# Patient Record
Sex: Male | Born: 1950 | Race: White | Hispanic: No | Marital: Married | Smoking: Never smoker
Health system: Southern US, Community
[De-identification: ages and names within clinical notes are randomized; demographics above are authoritative.]

## PROBLEM LIST (undated history)

## (undated) DIAGNOSIS — C439 Malignant melanoma of skin, unspecified: Secondary | ICD-10-CM

## (undated) DIAGNOSIS — K219 Gastro-esophageal reflux disease without esophagitis: Secondary | ICD-10-CM

## (undated) DIAGNOSIS — E039 Hypothyroidism, unspecified: Secondary | ICD-10-CM

## (undated) HISTORY — PX: COLONOSCOPY: SHX174

## (undated) HISTORY — PX: NASAL SINUS SURGERY: SHX719

## (undated) HISTORY — DX: Hypothyroidism, unspecified: E03.9

## (undated) HISTORY — DX: Gastro-esophageal reflux disease without esophagitis: K21.9

## (undated) HISTORY — PX: OTHER SURGICAL HISTORY: SHX169

## (undated) HISTORY — PX: MELANOMA EXCISION: SHX5266

## (undated) HISTORY — DX: Malignant melanoma of skin, unspecified: C43.9

---

## 2015-05-05 ENCOUNTER — Telehealth: Payer: Self-pay | Admitting: Cardiovascular Disease

## 2015-05-05 NOTE — Telephone Encounter (Signed)
ROI faxed to [facility] to obtain records.

## 2015-05-13 ENCOUNTER — Telehealth: Payer: Self-pay | Admitting: Cardiovascular Disease

## 2015-05-13 NOTE — Telephone Encounter (Signed)
Records rec from [place] placed in chart prep bin. Patient has NP patient appt 06/10/15 with Dr. Acie Fredrickson

## 2015-06-09 ENCOUNTER — Encounter: Payer: Self-pay | Admitting: *Deleted

## 2015-06-10 ENCOUNTER — Encounter: Payer: Self-pay | Admitting: Cardiology

## 2015-06-10 ENCOUNTER — Ambulatory Visit (INDEPENDENT_AMBULATORY_CARE_PROVIDER_SITE_OTHER): Payer: PRIVATE HEALTH INSURANCE | Admitting: Cardiology

## 2015-06-10 VITALS — BP 110/58 | HR 74 | Ht 68.0 in | Wt 168.2 lb

## 2015-06-10 DIAGNOSIS — Z9889 Other specified postprocedural states: Secondary | ICD-10-CM | POA: Insufficient documentation

## 2015-06-10 DIAGNOSIS — R079 Chest pain, unspecified: Secondary | ICD-10-CM | POA: Diagnosis not present

## 2015-06-10 DIAGNOSIS — K219 Gastro-esophageal reflux disease without esophagitis: Secondary | ICD-10-CM | POA: Diagnosis not present

## 2015-06-10 HISTORY — DX: Gastro-esophageal reflux disease without esophagitis: K21.9

## 2015-06-10 MED ORDER — FAMOTIDINE 20 MG PO TABS: 20.0000 mg | ORAL_TABLET | Freq: Every day | ORAL | Status: AC

## 2015-06-10 NOTE — Progress Notes (Signed)
Cardiology Office Note   Date:  06/10/2015   ID:  Robert Wolfe, DOB [birth year], MRN [id number]  PCP:  PROVIDER NOT IN SYSTEM    Chief Complaint  Patient presents with  . New Evaluation    chest pain      History of Present Illness: Robert Wolfe is a 64 y.o. male who presents for evaluation of chest pain.  He says that the pain always occurs at night.  It will wake him up and is burning up through his chest into his throat and then will have sharp pains.  The discomfort will be so bad that he feels he cant breathe.  It is worse with certain types of foods.  He will get gas with the pain and he will start to belch.  The pain radiates into his left arm.  Once he has belched a lot it relieves the pain.  He started taking Rolaids at night which has significantly helped.  He denies any burning in his chest during the day except early in the am when he is doing exercises.  He denies any palpitations, dizziness or syncope.  He denies any LE edema.    Past Medical History  Diagnosis Date  . Melanoma   . Hypothyroid     Past Surgical History  Procedure Laterality Date  . Broken nose    . Colonoscopy    . Nasal sinus surgery       Current Outpatient Prescriptions  Medication Sig Dispense Refill  . thyroid (NATURE-THROID) 32.5 MG tablet Take 32.5 mg by mouth daily.     No current facility-administered medications for this visit.    Allergies:   Review of patient's allergies indicates no known allergies.    Social History:  The patient  reports that he has never smoked. He does not have any smokeless tobacco history on file. He reports that he does not drink alcohol or use illicit drugs.   Family History:  The patient's family history is not on file.    ROS:  Please see the history of present illness.   Otherwise, review of systems are positive for none.   All other systems are reviewed and negative.    PHYSICAL EXAM: VS:  BP 110/58 mmHg  Pulse 74  Ht  5\' 8"  (1.727 m)  Wt 168 lb 3.2 oz (76.295 kg)  BMI 25.58 kg/m2 , BMI Body mass index is 25.58 kg/(m^2). GEN: Well nourished, well developed, in no acute distress HEENT: normal Neck: no JVD, carotid bruits, or masses Cardiac: RRR; no murmurs, rubs, or gallops,no edema  Respiratory:  clear to auscultation bilaterally, normal work of breathing GI: soft, nontender, nondistended, + BS MS: no deformity or atrophy Skin: warm and dry, no rash Neuro:  Strength and sensation are intact Psych: euthymic mood, full affect   EKG:  EKG is ordered today. The ekg ordered today demonstrates NSR at 74bpm with nonspecific T wave abnormality   Recent Labs: No results found for requested labs within last 365 days.    Lipid Panel No results found for: CHOL, TRIG, HDL, CHOLHDL, VLDL, LDLCALC, LDLDIRECT    Wt Readings from Last 3 Encounters:  06/10/15 168 lb 3.2 oz (76.295 kg)        ASSESSMENT AND PLAN:  1.  Chest pain that is mostly atypical.  It mainly only occurs at night and is burning with a lot of belching and  sharp pain.  This is most likely due to GERD with esophageal spasm.  His only risk factors for CAD are male sex and age>40.  I will set him up for an ETT to rule out ischemia.   2.  Hypothyroidism - per PCP 3.  GERD - I am going to start him on OTC Prilosec 20mg  daily.  I will have him followup with my extender in 4 weeks to see if he is better   Current medicines are reviewed at length with the patient today.  The patient does not have concerns regarding medicines.  The following changes have been made:  no change  Labs/ tests ordered today: See above Assessment and Plan No orders of the defined types were placed in this encounter.     Disposition:   FU with me PRN pending results of studies  Signed, Sueanne Margarita, MD  06/10/2015 4:21 PM    [facility] [place], [place], [place]  [address] Phone: [phone]; Fax: [phone]

## 2015-06-10 NOTE — Patient Instructions (Signed)
Medication Instructions:  Your physician has recommended you make the following change in your medication:  1) START OTC PEPCID 20 mg daily  Labwork: None  Testing/Procedures: Your physician has requested that you have an exercise tolerance test. For further information please visit [email or web address]. Please also follow instruction sheet, as given.  Follow-Up: Your physician recommends that you schedule a follow-up appointment in: 4 weeks with a PA or NP (after stress test).  Your physician recommends that you schedule a follow-up appointment AS NEEDED with Dr. Radford Pax pending your study results.  Any Other Special Instructions Will Be Listed Below (If Applicable).

## 2015-06-17 ENCOUNTER — Ambulatory Visit: Payer: Self-pay | Admitting: Cardiovascular Disease

## 2015-07-07 ENCOUNTER — Telehealth (HOSPITAL_COMMUNITY): Payer: Self-pay

## 2015-07-07 NOTE — Telephone Encounter (Signed)
Encounter complete. 

## 2015-07-08 ENCOUNTER — Telehealth (HOSPITAL_COMMUNITY): Payer: Self-pay

## 2015-07-08 NOTE — Telephone Encounter (Signed)
Encounter complete. 

## 2015-07-09 ENCOUNTER — Ambulatory Visit (HOSPITAL_COMMUNITY)
Admission: RE | Admit: 2015-07-09 | Discharge: 2015-07-09 | Disposition: A | Discharge status: Home / Self Care | Payer: Managed Care, Other (non HMO) | Source: Ambulatory Visit | Attending: Cardiology | Admitting: Cardiology

## 2015-07-09 DIAGNOSIS — R079 Chest pain, unspecified: Secondary | ICD-10-CM | POA: Diagnosis present

## 2015-07-09 DIAGNOSIS — R9439 Abnormal result of other cardiovascular function study: Secondary | ICD-10-CM | POA: Insufficient documentation

## 2015-07-10 LAB — EXERCISE TOLERANCE TEST
CHL CUP MPHR: 157 {beats}/min
CHL CUP RESTING HR STRESS: 76 {beats}/min
CSEPED: 13 min
CSEPEDS: 30 s
CSEPEW: 16.2 METS
Peak HR: 164 {beats}/min
Percent HR: 104 %
RPE: 16

## 2015-07-16 NOTE — Progress Notes (Signed)
Cardiology Office Note   Date:  07/16/2015   ID:  Robert Wolfe, DOB 1951/03/13, MRN [id number]  PCP:  PROVIDER NOT IN SYSTEM  Cardiologist:  Dr. Fransico Him   Electrophysiologist:  n/a  No chief complaint on file.    History of Present Illness: Robert Wolfe is a 64 y.o. male with a hx of    Studies/Reports Reviewed Today:  ETT 10/16 Abnormal exercise treadmill stress test with 4 mm horizontal ST depression during peak stress in the inferior and lateral leads suggestive of ischemia. The patient, however, experienced no chest pain and exercise tolerance was excellent with 16.2 METS achieved. Clinical correlation is advised. Additional testing may be warranted.    Past Medical History  Diagnosis Date  . Melanoma   . Hypothyroid   . GERD (gastroesophageal reflux disease) 06/10/2015    Past Surgical History  Procedure Laterality Date  . Broken nose    . Colonoscopy    . Nasal sinus surgery    . Melanoma excision       Current Outpatient Prescriptions  Medication Sig Dispense Refill  . famotidine (PEPCID) 20 MG tablet Take 1 tablet (20 mg total) by mouth daily.    Marland Kitchen thyroid (NATURE-THROID) 32.5 MG tablet Take 32.5 mg by mouth daily.     No current facility-administered medications for this visit.    Allergies:   Review of patient's allergies indicates no known allergies.    Social History:  The patient  reports that he has never smoked. He does not have any smokeless tobacco history on file. He reports that he does not drink alcohol or use illicit drugs.   Family History:  The patient's family history is not on file.    ROS:   Please see the history of present illness.   ROS    PHYSICAL EXAM: VS:  There were no vitals taken for this visit.    Wt Readings from Last 3 Encounters:  06/10/15 168 lb 3.2 oz (76.295 kg)     GEN: Well nourished, well developed, in no acute distress HEENT: normal Neck: no JVD, no carotid bruits, no masses Cardiac:   Normal S1/S2, RRR; no murmur ,  no rubs or gallops, no edema  Respiratory:  clear to auscultation bilaterally, no wheezing, rhonchi or rales. GI: soft, nontender, nondistended, + BS MS: no deformity or atrophy Skin: warm and dry  Neuro:  CNs II-XII intact, Strength and sensation are intact Psych: Normal affect   EKG:  EKG is ordered today.  It demonstrates:      Recent Labs: No results found for requested labs within last 365 days.    Lipid Panel No results found for: CHOL, TRIG, HDL, CHOLHDL, VLDL, LDLCALC, LDLDIRECT    ASSESSMENT AND PLAN:      Medication Changes: Current medicines are reviewed at length with the patient today.  Concerns regarding medicines are as outlined above.  The following changes have been made:   Discontinued Medications   No medications on file   Modified Medications   No medications on file   New Prescriptions   No medications on file   Labs/ tests ordered today include:   No orders of the defined types were placed in this encounter.      Disposition:    FU with     Signed, Richardson Dopp, PA-C, MHS 07/16/2015 5:10 PM    [facility] [place], [place], [place]  [address] Phone: [phone]; Fax: [phone]  This encounter was created in error - please disregard.

## 2015-07-17 ENCOUNTER — Encounter: Payer: PRIVATE HEALTH INSURANCE | Admitting: Physician Assistant

## 2015-07-17 ENCOUNTER — Telehealth: Payer: Self-pay

## 2015-07-17 DIAGNOSIS — R9431 Abnormal electrocardiogram [ECG] [EKG]: Secondary | ICD-10-CM

## 2015-07-17 DIAGNOSIS — Z01812 Encounter for preprocedural laboratory examination: Secondary | ICD-10-CM

## 2015-07-17 NOTE — Telephone Encounter (Signed)
-----   Message from Sueanne Margarita, MD sent at 07/12/2015 11:58 PM EDT ----- Please let patient know that stress test was abnormal but symptoms are very atypical for CAD. Please set up for coronary CTA with morphology and calcium score ASAP for Dr. Meda Coffee to read

## 2015-07-17 NOTE — Telephone Encounter (Signed)
Informed patient of results and verbal understanding expressed.   Coronary CT ordered for scheduling. Patient agrees with treatment plan. 

## 2015-07-21 ENCOUNTER — Telehealth: Payer: Self-pay

## 2015-07-21 NOTE — Telephone Encounter (Signed)
From: Sueanne Margarita, MD   Sent: 07/20/2015 12:08 PM    To: Lillia Pauls  Subject: RE: coronary CT                  Please order a calcium score    Left message to call back re: Case will not approve d/t pt has not had any negative enzymes or positive CA score.

## 2015-07-22 NOTE — Telephone Encounter (Signed)
Informed patient that insurance will not cover coronary CT without a calcium score done first. Informed patient a calcium score would be self pay $150. Patient is annoyed that insurance will not cover the calcium score. He understands to think about this an confirms he will call tomorrow to have the calcium score scheduled or not.

## 2015-07-22 NOTE — Telephone Encounter (Signed)
Follow up  ° ° °Patient returning call back to nurse  °

## 2015-07-23 ENCOUNTER — Encounter: Payer: Self-pay | Admitting: Cardiology

## 2015-08-03 ENCOUNTER — Ambulatory Visit (HOSPITAL_COMMUNITY): Admission: RE | Admit: 2015-08-03 | Payer: PRIVATE HEALTH INSURANCE | Source: Ambulatory Visit

## 2015-08-04 ENCOUNTER — Other Ambulatory Visit: Payer: Self-pay

## 2015-08-04 ENCOUNTER — Other Ambulatory Visit (INDEPENDENT_AMBULATORY_CARE_PROVIDER_SITE_OTHER): Payer: PRIVATE HEALTH INSURANCE | Admitting: *Deleted

## 2015-08-04 DIAGNOSIS — R079 Chest pain, unspecified: Secondary | ICD-10-CM

## 2015-08-04 DIAGNOSIS — Z01812 Encounter for preprocedural laboratory examination: Secondary | ICD-10-CM | POA: Diagnosis not present

## 2015-08-04 LAB — BASIC METABOLIC PANEL
BUN: 18 mg/dL (ref 7–25)
CHLORIDE: 103 mmol/L (ref 98–110)
CO2: 26 mmol/L (ref 20–31)
CREATININE: 0.91 mg/dL (ref 0.70–1.25)
Calcium: 8.6 mg/dL (ref 8.6–10.3)
Glucose, Bld: 79 mg/dL (ref 65–99)
POTASSIUM: 4 mmol/L (ref 3.5–5.3)
Sodium: 136 mmol/L (ref 135–146)

## 2015-08-04 LAB — TROPONIN I

## 2015-08-04 NOTE — Addendum Note (Signed)
Addended by: Eulis Foster on: 08/04/2015 04:14 PM   Modules accepted: Orders

## 2015-08-09 ENCOUNTER — Encounter: Payer: Self-pay | Admitting: Cardiology

## 2015-08-11 ENCOUNTER — Other Ambulatory Visit: Payer: Self-pay

## 2015-08-11 DIAGNOSIS — R079 Chest pain, unspecified: Secondary | ICD-10-CM

## 2015-09-08 ENCOUNTER — Telehealth: Payer: Self-pay

## 2015-09-08 NOTE — Telephone Encounter (Signed)
Robert Wolfe - Cardiac Ct >','<< Less Detail',event)" href="javascript:;">More Detail >>   Cardiac Ct    Armando Gang    Sent: Tue September 08, 2015 4:40 PM    To: Theodoro Parma, RN        Message     On 08-13-15 Left message to call and schedule CT.  Spoke with [name] on 08-18-15 to schedule test. He didn't wan to schedule test not until he spoke with his insurance to see how much he will have pay out of pocket.  Spoke with patient today, to see if he wanted to schedule the test. He don't due to having problems with stomach. He is going to see a stomach doctor this week. He don't wish to do the test.

## 2015-09-11 ENCOUNTER — Other Ambulatory Visit: Payer: Self-pay | Admitting: Physician Assistant

## 2015-09-11 DIAGNOSIS — R0789 Other chest pain: Secondary | ICD-10-CM

## 2015-09-11 DIAGNOSIS — K219 Gastro-esophageal reflux disease without esophagitis: Secondary | ICD-10-CM

## 2015-09-13 ENCOUNTER — Encounter: Payer: Self-pay | Admitting: Cardiology

## 2015-09-15 ENCOUNTER — Ambulatory Visit
Admission: RE | Admit: 2015-09-15 | Discharge: 2015-09-15 | Disposition: A | Discharge status: Home / Self Care | Payer: PRIVATE HEALTH INSURANCE | Source: Ambulatory Visit | Attending: Physician Assistant | Admitting: Physician Assistant

## 2015-09-15 ENCOUNTER — Telehealth: Payer: Self-pay | Admitting: Cardiology

## 2015-09-15 DIAGNOSIS — K219 Gastro-esophageal reflux disease without esophagitis: Secondary | ICD-10-CM

## 2015-09-15 DIAGNOSIS — R0789 Other chest pain: Secondary | ICD-10-CM

## 2015-09-15 NOTE — Telephone Encounter (Signed)
09-15-15 Auth for Cardiac CTA, CPT [id number] updated.  Per [place], [phone], previous expired (09-02-15) [id number] updated and extended to 10-03-15. Message sent to Mack Guise to r/s pt.

## 2015-09-17 ENCOUNTER — Telehealth: Payer: Self-pay | Admitting: Cardiology

## 2015-09-17 NOTE — Telephone Encounter (Signed)
09-14-15 Left voice message to call and schedule Cardiac Ct.   Today spoke with Robert Wolfe to schedule test.  He stated that he and his wife has decided to wait til the being of the year to have the test done.  I explain that I will to send the information back to precert and I will call him when I get the ok to schedule test.

## 2015-10-07 ENCOUNTER — Telehealth: Payer: Self-pay | Admitting: Cardiology

## 2015-10-07 NOTE — Telephone Encounter (Signed)
Per [place], [phone], Cardiac CTA, CPT [phone] extended to 11-07-15. Message sent to Mack Guise to r/s pt

## 2016-02-13 IMAGING — RF DG UGI W/ HIGH DENSITY W/KUB
17 series · 17 of 17 positions shown · non-contrast
Comparison: None in PACs

CLINICAL DATA: [DATE] month history of gastroesophageal reflux,
atypical chest pain

EXAM:
UPPER GI SERIES WITH KUB
TECHNIQUE: After obtaining a scout radiograph a routine upper GI series was
performed using thin and high density barium. Effervescent crystals
and a 13 mm barium tablet were administered as well.
FLUOROSCOPY TIME:  Radiation Exposure Index (as provided by the
fluoroscopic device): 84 d1ycmU
Fluoroscopy Time (in minutes and seconds):  1 minutes, 42 seconds

[Series 1: run · 1 of 1 slices shown (1 of 16)]
[im 1/1]
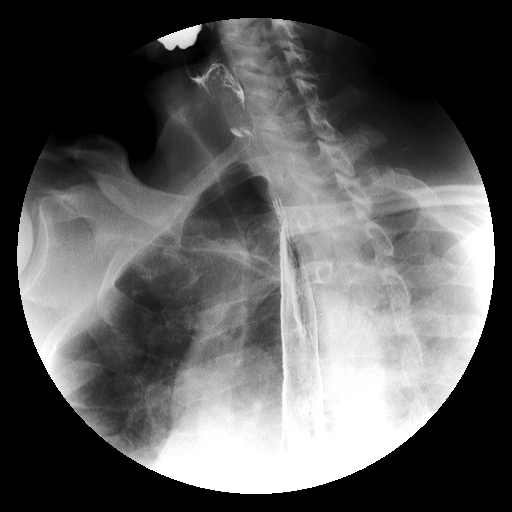

[Series 2: run · 1 of 1 slices shown (2 of 16)]
[im 1/1]
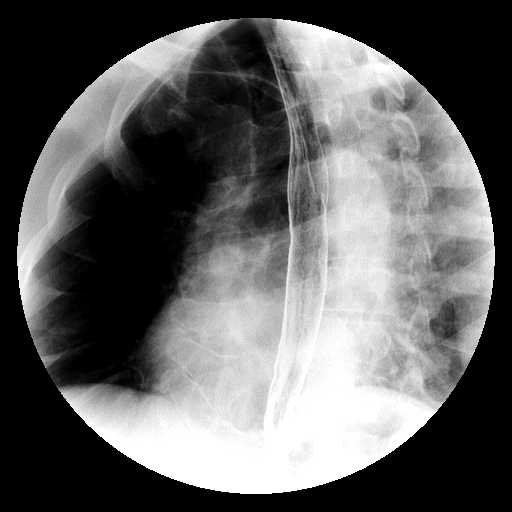

[Series 3: run · 1 of 1 slices shown (3 of 16)]
[im 1/1]
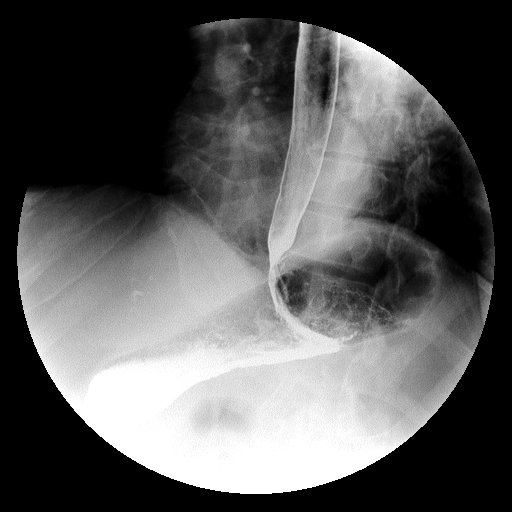

[Series 4: run · 1 of 1 slices shown (4 of 16)]
[im 1/1]
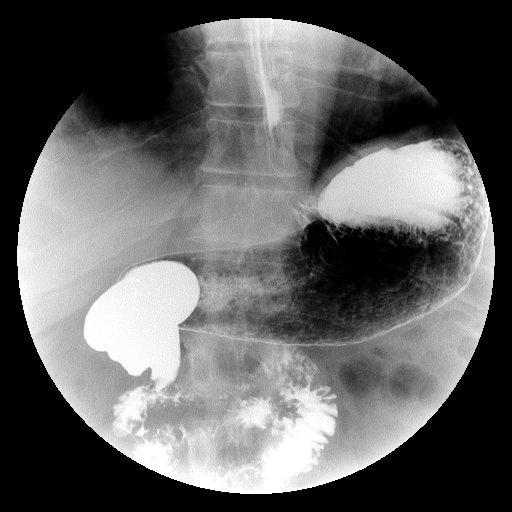

[Series 5: run · 1 of 1 slices shown (5 of 16)]
[im 1/1]
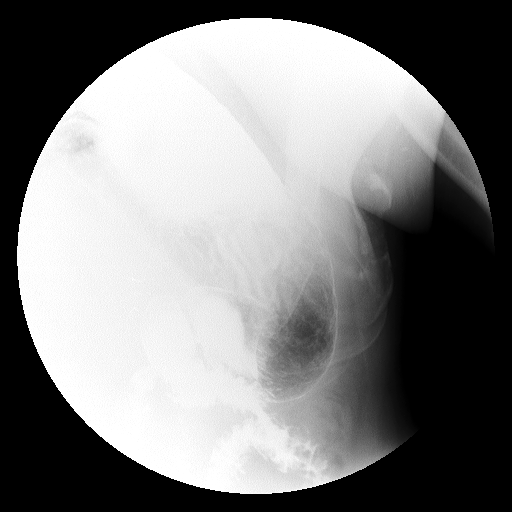

[Series 6: run · 1 of 1 slices shown (6 of 16)]
[im 1/1]
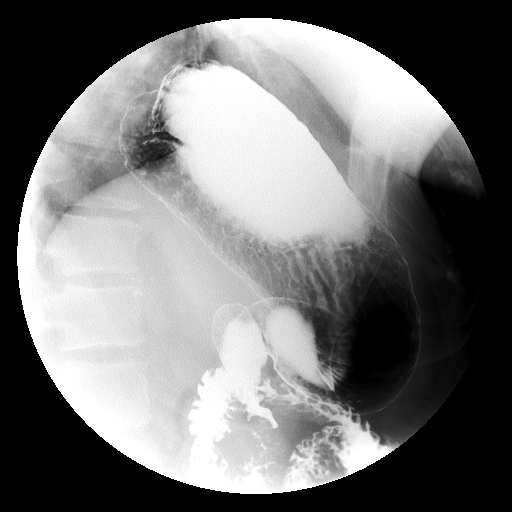

[Series 7: run · 1 of 1 slices shown (7 of 16)]
[im 1/1]
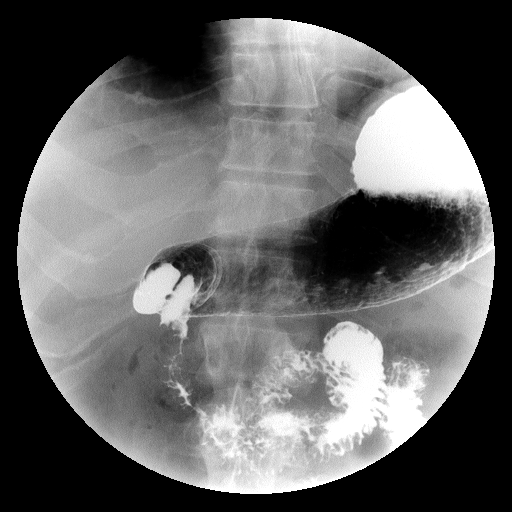

[Series 8: run · 1 of 1 slices shown (8 of 16)]
[im 1/1]
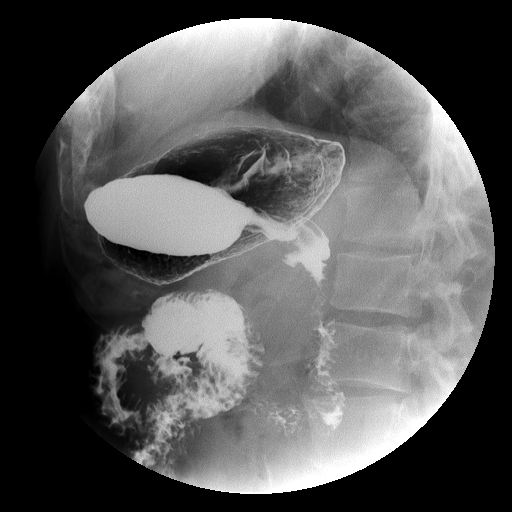

[Series 9: run · 1 of 1 slices shown (9 of 16)]
[im 1/1]
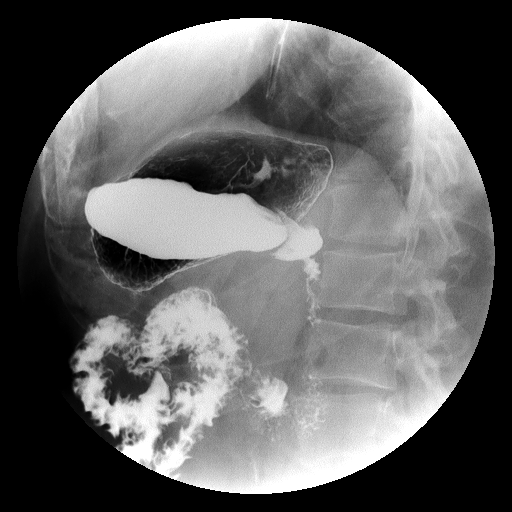

[Series 10: run · 1 of 1 slices shown (10 of 16)]
[im 1/1]
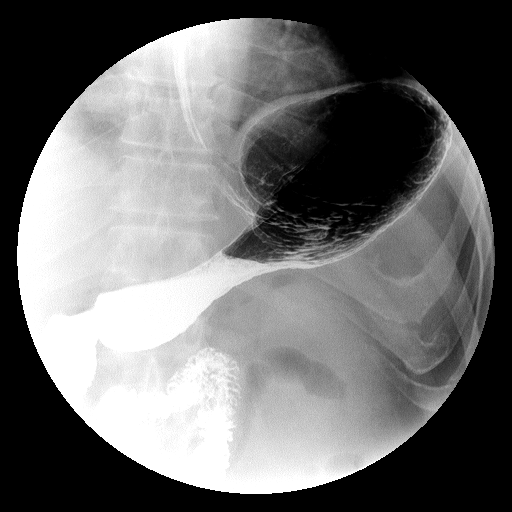

[Series 11: run · 1 of 1 slices shown (11 of 16)]
[im 1/1]
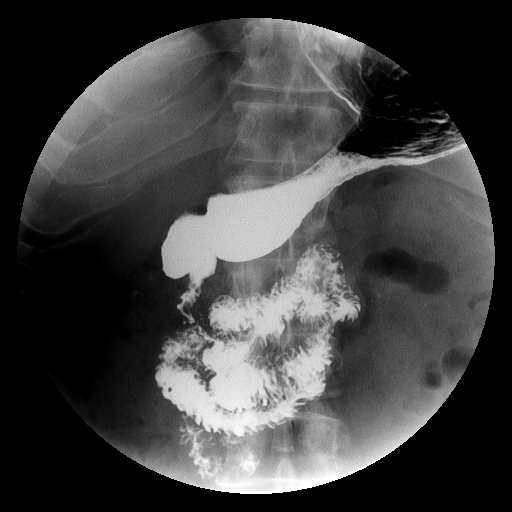

[Series 12: run · 1 of 1 slices shown (12 of 16)]
[im 1/1]
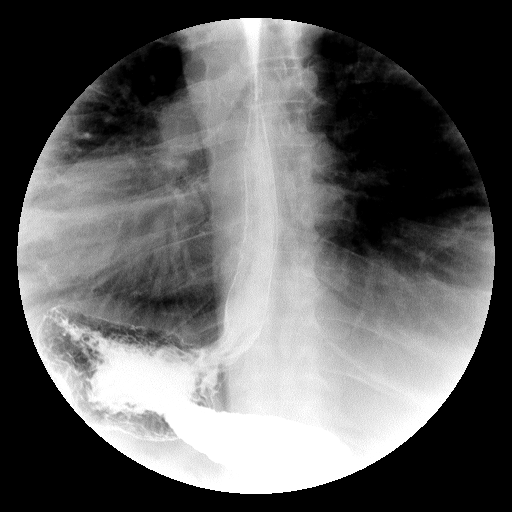

[Series 13: run · 1 of 1 slices shown (13 of 16)]
[im 1/1]
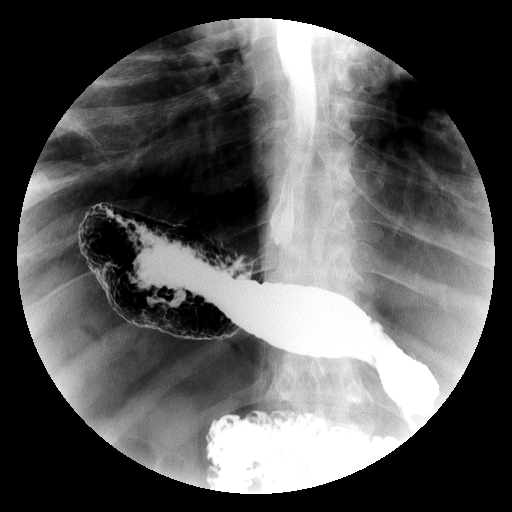

[Series 14: run · 1 of 1 slices shown (14 of 16)]
[im 1/1]
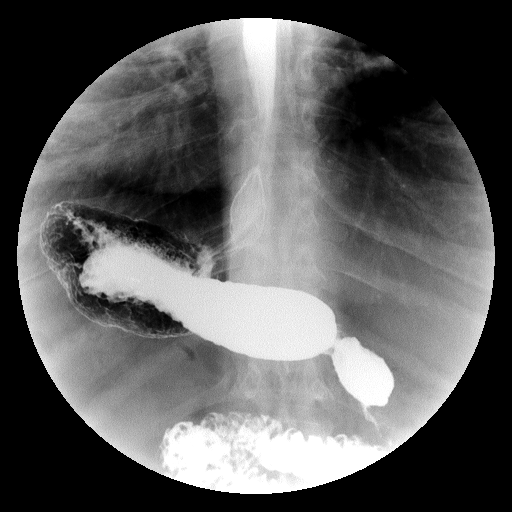

[Series 15: run · 1 of 1 slices shown (15 of 16)]
[im 1/1]
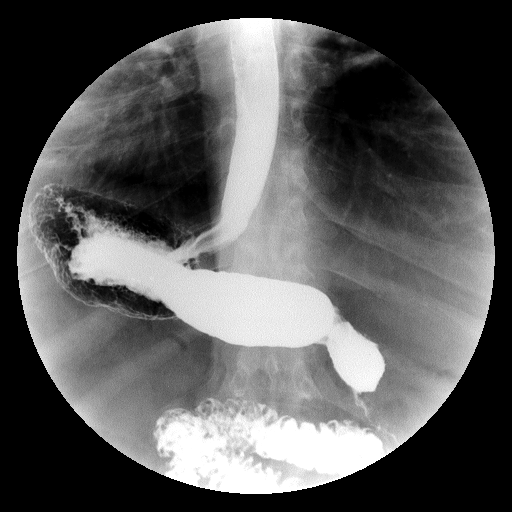

[Series 16: run · 1 of 1 slices shown (16 of 16)]
[im 1/1]
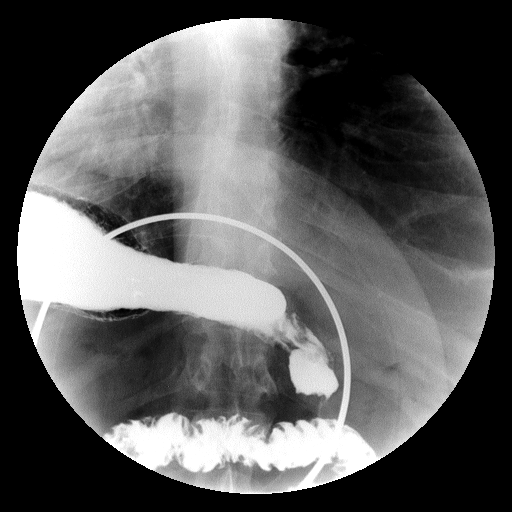

[Series 1001: view not recorded · 0.20mm/px · 1 of 1 slices shown]
[im 1/1]
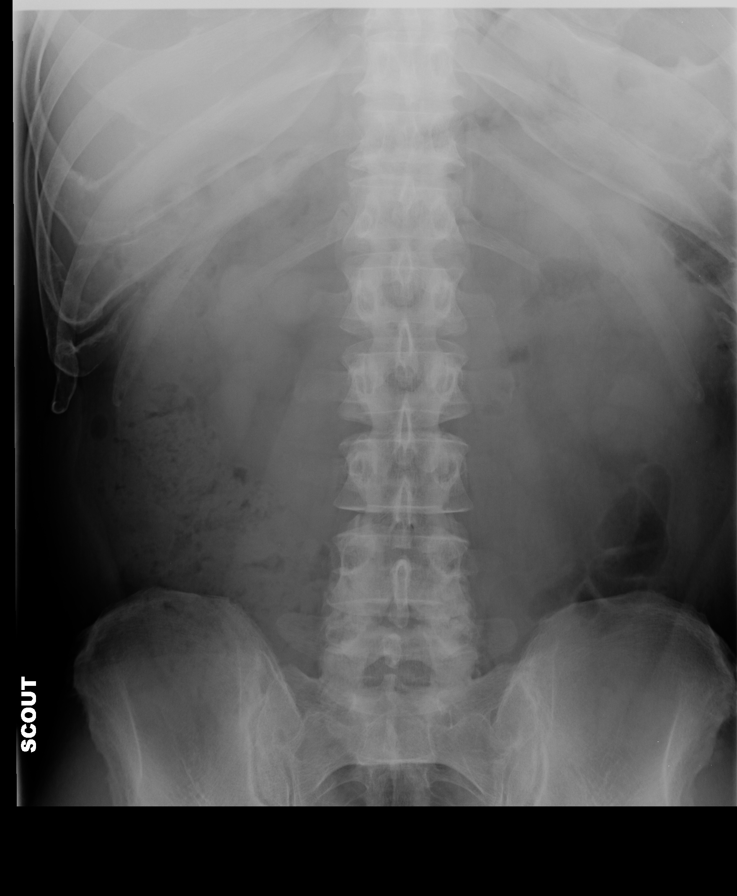

[17 of 17 positions shown; findings below may reference images not displayed]

FINDINGS: The patient ingested the thick and thin barium and the gas-forming
crystals without difficulty. The cervical esophagus was normal in a
survey fashion. The thoracic esophagus distended well. There was no
evidence of stricture. There was a small reducible hiatal hernia
which was transiently observed. There was a small amount of
spontaneous gastroesophageal reflux. No ulceration was observed.

The stomach distended reasonably well though the patient lost some
of the gas generated by the gas-forming crystals. Few mucosal
pattern was normal. The mucosal folds were pliable. The duodenal
bulb and Celsius sweep were normal in appearance.

The barium tablet passed promptly from the mouth to the stomach.
IMPRESSION: 1. Small reducible hiatal hernia with spontaneous gastroesophageal
reflux. There is no radiographic evidence of esophagitis. No
stricture was observed.
2. The stomach was normal in appearance given the limited distention
available. There was no evidence of an ulcer niche nor of any other
mucosal abnormalities.
